# Patient Record
Sex: Male | Born: 1980 | Race: Black or African American | Hispanic: No | Marital: Married | Smoking: Current every day smoker
Health system: Southern US, Community
[De-identification: ages and names within clinical notes are randomized; demographics above are authoritative.]

---

## 2005-01-10 ENCOUNTER — Emergency Department (HOSPITAL_COMMUNITY): Admission: EM | Admit: 2005-01-10 | Discharge: 2005-01-11 | Payer: Self-pay | Admitting: Emergency Medicine

## 2006-03-23 ENCOUNTER — Emergency Department (HOSPITAL_COMMUNITY): Admission: EM | Admit: 2006-03-23 | Discharge: 2006-03-23 | Payer: Self-pay | Admitting: Emergency Medicine

## 2008-01-02 ENCOUNTER — Inpatient Hospital Stay (HOSPITAL_COMMUNITY): Admission: EM | Admit: 2008-01-02 | Discharge: 2008-01-05 | Payer: Self-pay | Admitting: Emergency Medicine

## 2009-05-02 IMAGING — CR DG ABDOMEN ACUTE W/ 1V CHEST
3 series · 3 of 3 positions shown · non-contrast
Comparison: None

CLINICAL DATA: Nausea vomiting mid abdominal pain.

ACUTE ABDOMEN SERIES (ABDOMEN 2 VIEW & CHEST 1 VIEW)

[w chest pa]
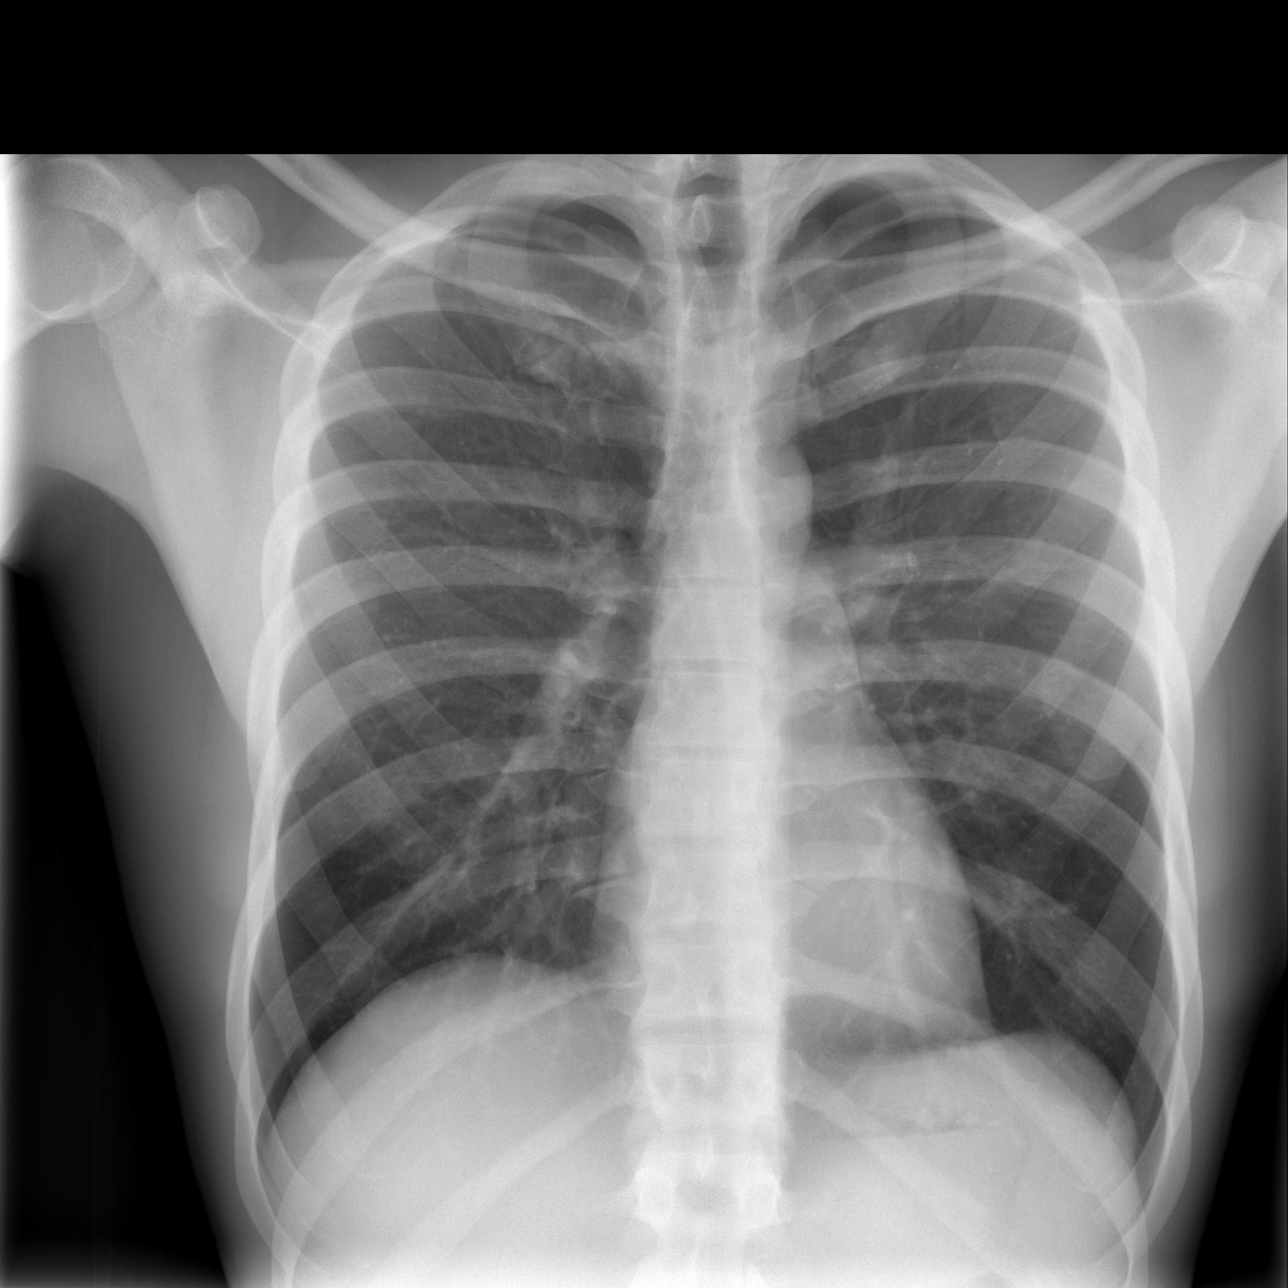

[w abdomen upright *]
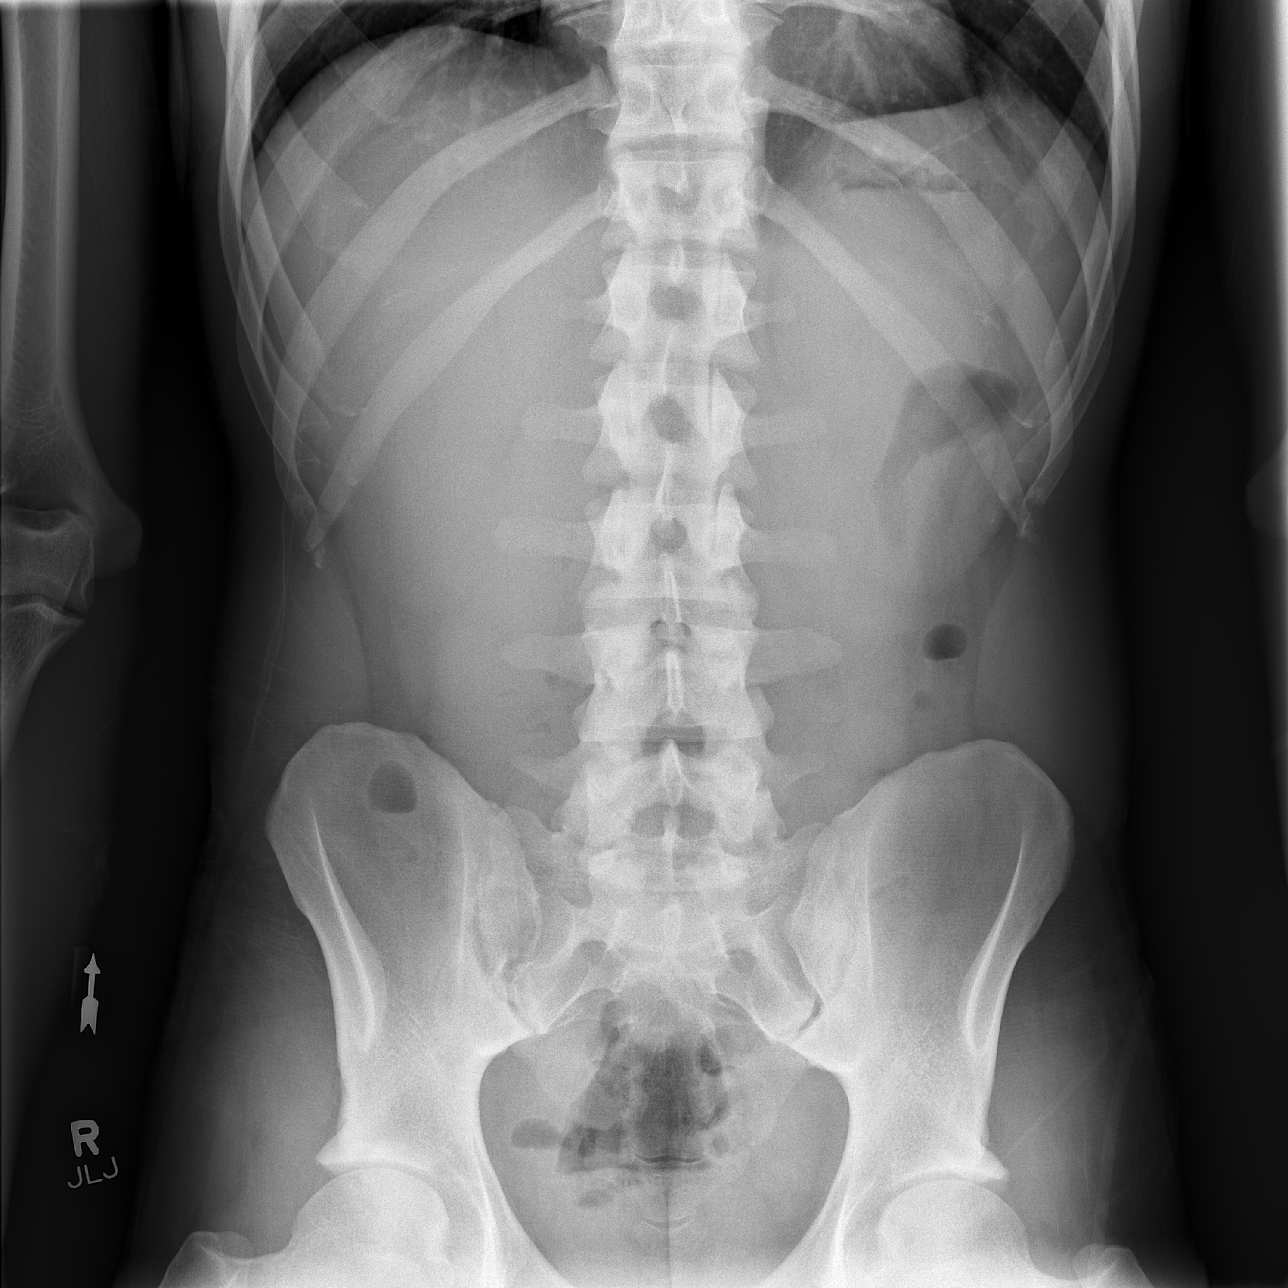

[t abdomen supine]
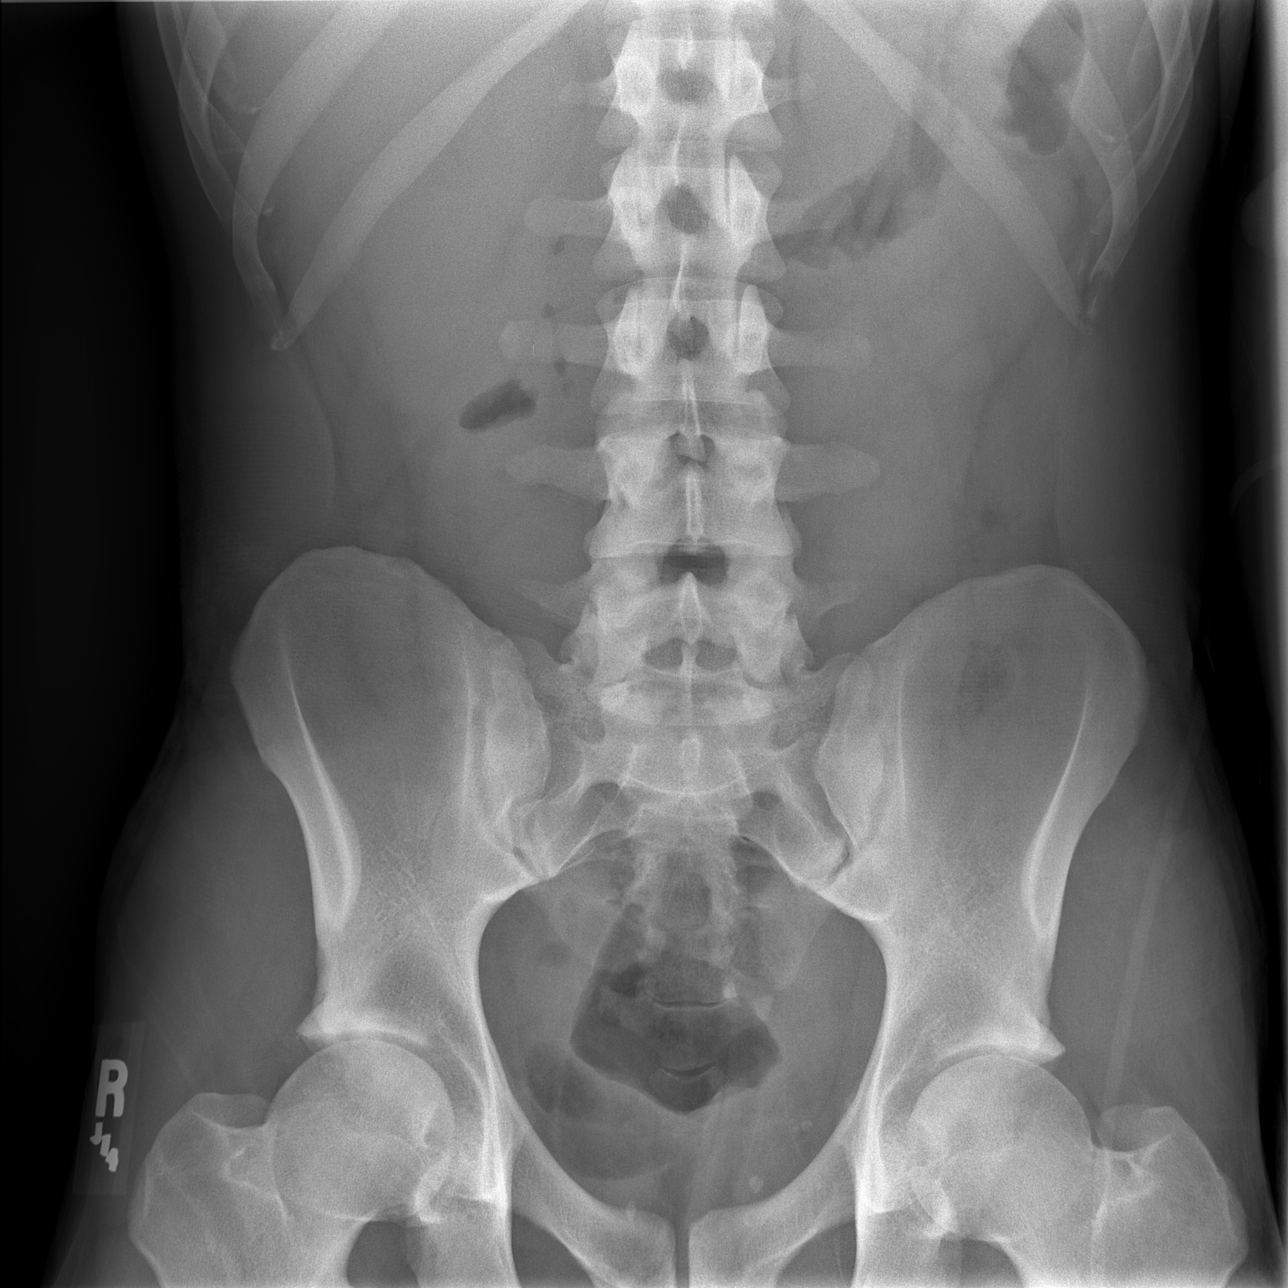

[3 of 3 positions shown; findings below may reference images not displayed]

FINDINGS: Normal chest.  Normal bowel gas pattern.  Negative for
ileus or bowel obstruction.  Properitoneal fat stripes and psoas
muscle margins are defined.  No unusual calcification.
IMPRESSION: No acute chest or abdominal disease.

## 2010-08-24 NOTE — H&P (Signed)
Phillip [name], Phillip [name]   MEDICAL RECORD NO.:  [name]          PATIENT TYPE:  INP   LOCATION:  0107                         FACILITY:  [facility]   PHYSICIAN:  Vania Rea, M.D. DATE OF BIRTH:  July 03, 1980   DATE OF ADMISSION:  01/02/2008  DATE OF DISCHARGE:                              HISTORY & PHYSICAL   PRIMARY CARE PHYSICIAN:  Unassigned.   CHIEF COMPLAINT:  Fever, nausea and vomiting for 3 days.   HISTORY OF PRESENT ILLNESS:  This is a 30 year old African American  gentleman who has had symptoms and complicated with respiratory tract  infection of fever, headache, nasal stuffiness with nausea and vomiting  for the past 3 days.  The patient has tried to self medicate with  Theraflu, Advil and NyQuil, but says they only make him drowsy; but he  continues to feel very sick.  Eventually he came to the emergency room  to be evaluated.  He was found to be dehydrated and very ill looking.  He has not responded to simple measures in the emergency room, and the  hospitalist service was called to assist in management.   PAST MEDICAL HISTORY:  No significant.   MEDICATIONS:  1. Theraflu.  2. Advil.  3. NyQuil  As noted above.   ALLERGIES:  NO KNOWN DRUG ALLERGIES.   SOCIAL HISTORY:  He is an unemployed Estate agent.  He smokes 1/2  pack a day.  He drinks alcohol 3-4 drinks a weekend.  He smokes  marijuana occasionally on the weekend.   FAMILY HISTORY:  Significant for father who died at his birth with an  acute MI.  Mother suffers with seizure disorders; otherwise  unremarkable.   REVIEW OF SYSTEMS:  Significant only for symptoms associated with upper  respiratory infection such as headache, sore throat, cough productive of  green sputum, back and shoulder pain.   PHYSICAL EXAMINATION:  Ill-looking hot-to-touch young African American  gentleman, lying on the stretcher.  VITALS:  Temperature 103.4, pulse 77, respirations 20, blood  pressure  115/74, saturations 100% on room air.  Pupils are round and equal.  Mucous membranes are pink.  Anicteric.  NECK:  Supple.  No cervical lymphadenopathy adenopathy or thyromegaly.  CHEST:  Clear to auscultation bilaterally.  CARDIOVASCULAR SYSTEM:  Regular rate and rhythm without murmurs.  ABDOMEN:  Scaphoid, soft and nontender.  EXTREMITIES:  Without edema. He has no bone or joint deformities.  CNS:  Cranial nerves II-XII are grossly intact.  Nonfocal.  No  neurologic deficits.   LABORATORY STUDIES:  White count 14.1, hemoglobin 14.6, platelets 230.  He has 8-5% neutrophils and 8% monocytes; no bandemia is described on  film.  Sodium 139, potassium 3.1, chloride 103, CO2 25, glucose 190,  creatinine 1.41.  Lipase is normal.   His chest and abdominal x-rays revealed no evidence of acute disease.   ASSESSMENT:  Respiratory infection, probably viral.  Hypokalemia   PLAN:  Will admit this gentleman for IV fluid hydration, replacement of  his potassium.  Will do final studies such as influenza A&B, since he  has not been  immunized.  Will withhold antibiotics at this time, since  there is no clear evidence of septic focus.  Will review this  gentleman's medical care as time goes by.      Vania Rea, M.D.  Electronically Signed     LC/MEDQ  D:  01/02/2008  T:  01/02/2008  Job:  [id number]

## 2010-08-24 NOTE — Discharge Summary (Signed)
NAMEVAHE, PIENTA NO.:  [id number]   MEDICAL RECORD NO.:  [id number]          PATIENT TYPE:  INP   LOCATION:  1613                         FAC[facility]  [facility]   PHYSICIAN:  Eduard Clos, MDDATE OF BIRTH:  Jun 15, 1980   DATE OF ADMISSION:  01/02/2008  DATE OF DISCHARGE:                               DISCHARGE SUMMARY   COURSE IN THE HOSPITAL:  A 30 year old male with a significant past  medical history fever nasal stuffiness, sore throat was admitted to the  medical floor.  Blood cultures were obtained on admission.  Chest x-ray  shows no acute findings.  The patient had fevers around 102-103.  Was  started on empiric antibiotic and Tamiflu was eventually added.  Influenza A and B swabs, and strep B was negative.  The patient's  symptoms improved at this time.  The patient is very eager to go home.  Culture was drawn, showing no growth so far.  As the patient's symptoms  have improved and he is eager to go home, we will discharge the patient  to home and advised to follow with the ER if there is any recurrence of  symptoms.  The patient has also been advised to quit smoking, drinking  alcohol, or using illegal drugs.   FINAL DIAGNOSIS:  1. Fever, probably from viral syndrome and upper respiratory      infection.  2. Dehydration.   MEDICATION ON DISCHARGE:  1. Doxycycline 100 mg p.o. b.i.d. for 7 days.  2. Tamiflu 75 mg p.o. b.i.d. for 4 days.   PLAN:  The patient advised to follow with his primary care physician  within 1 week's time.  Information about [facility] has been provided  to the patient.  To go to the nearest ER if fever recurs.  The patient  advised to quit smoking, drinking alcohol, and abusing drugs.      Eduard Clos, MD  Electronically Signed     ANK/MEDQ  D:  01/05/2008  T:  01/06/2008  Job:  [id number]

## 2011-01-10 LAB — BASIC METABOLIC PANEL
BUN: 4 — ABNORMAL LOW
BUN: 7
CO2: 25
CO2: 25
Calcium: 8 — ABNORMAL LOW
Calcium: 8.2 — ABNORMAL LOW
Chloride: 102
Chloride: 105
Creatinine, Ser: 1.18
Creatinine, Ser: 1.35
GFR calc Af Amer: >60
GFR calc Af Amer: >60
GFR calc non Af Amer: >60
GFR calc non Af Amer: >60
Glucose, Bld: 103 — ABNORMAL HIGH
Glucose, Bld: 111 — ABNORMAL HIGH
Potassium: 3.6
Potassium: 4
Sodium: 133 — ABNORMAL LOW
Sodium: 135

## 2011-01-10 LAB — DIFFERENTIAL
Basophils Absolute: 0
Basophils Relative: 0
Eosinophils Absolute: 0
Eosinophils Relative: 0
Lymphocytes Relative: 7 — ABNORMAL LOW
Lymphs Abs: 1
Monocytes Absolute: 1.2 — ABNORMAL HIGH
Monocytes Relative: 8
Neutro Abs: 11.9 — ABNORMAL HIGH
Neutrophils Relative %: 85 — ABNORMAL HIGH

## 2011-01-10 LAB — CBC
HCT: 36.7 — ABNORMAL LOW
HCT: 44
Hemoglobin: 12.1 — ABNORMAL LOW
Hemoglobin: 14.6
MCHC: 33.1
MCHC: 33.2
MCV: 91.5
MCV: 91.6
Platelets: 200
Platelets: 230
RBC: 4 — ABNORMAL LOW
RBC: 4.81
RDW: 14.3
RDW: 14.8
WBC: 14.1 — ABNORMAL HIGH
WBC: 9.8

## 2011-01-10 LAB — COMPREHENSIVE METABOLIC PANEL
ALT: 13
AST: 29
Albumin: 3.6
Alkaline Phosphatase: 104
BUN: 7
CO2: 25
Calcium: 9.2
Chloride: 103
Creatinine, Ser: 1.41
GFR calc Af Amer: >60
GFR calc non Af Amer: >60
Glucose, Bld: 109 — ABNORMAL HIGH
Potassium: 3.1 — ABNORMAL LOW
Sodium: 139
Total Bilirubin: 1
Total Protein: 7.6

## 2011-01-10 LAB — INFLUENZA A+B VIRUS AG-DIRECT(RAPID)
Inflenza A Ag: NEGATIVE
Influenza B Ag: NEGATIVE

## 2011-01-10 LAB — CULTURE, BLOOD (ROUTINE X 2)
Culture: NO GROWTH
Culture: NO GROWTH

## 2011-01-10 LAB — LIPASE, BLOOD: Lipase: 15

## 2011-01-10 LAB — MAGNESIUM
Magnesium: 1.3 — ABNORMAL LOW
Magnesium: 1.8

## 2011-05-19 ENCOUNTER — Emergency Department (HOSPITAL_COMMUNITY)
Admission: EM | Admit: 2011-05-19 | Discharge: 2011-05-19 | Disposition: A | Discharge status: Home / Self Care | Payer: Self-pay | Attending: Emergency Medicine | Admitting: Emergency Medicine

## 2011-05-19 ENCOUNTER — Encounter (HOSPITAL_COMMUNITY): Payer: Self-pay | Admitting: Emergency Medicine

## 2011-05-19 DIAGNOSIS — IMO0002 Reserved for concepts with insufficient information to code with codable children: Secondary | ICD-10-CM | POA: Insufficient documentation

## 2011-05-19 DIAGNOSIS — M7989 Other specified soft tissue disorders: Secondary | ICD-10-CM | POA: Insufficient documentation

## 2011-05-19 DIAGNOSIS — L732 Hidradenitis suppurativa: Secondary | ICD-10-CM | POA: Insufficient documentation

## 2011-05-19 MED ORDER — OXYCODONE-ACETAMINOPHEN 5-325 MG PO TABS: 1.0000 | ORAL_TABLET | Freq: Four times a day (QID) | ORAL | Status: AC | PRN

## 2011-05-19 MED ORDER — DOXYCYCLINE HYCLATE 100 MG PO CAPS: 100.0000 mg | ORAL_CAPSULE | Freq: Two times a day (BID) | ORAL | Status: AC

## 2011-05-19 NOTE — ED Notes (Signed)
Pt c/o bilateral axillary abscess x several weeks; pt sts right side draining

## 2011-05-19 NOTE — ED Provider Notes (Signed)
History     CSN: [id number]  Arrival date & time 05/19/11  1050   First MD Initiated Contact with Patient 05/19/11 1334      Chief Complaint  Patient presents with  . Abscess    (Consider location/radiation/quality/duration/timing/severity/associated sxs/prior treatment) HPI Patient presents with abscess in both axilla for the last several weeks.  Patient states that he has had no fevers or nausea/vomiting.  The patient states that he lanced the areas with a needle in the past and the areas returned.      History reviewed. No pertinent past surgical history.  History reviewed. No pertinent family history.  History  Substance Use Topics  . Smoking status: Current Everyday Smoker  . Smokeless tobacco: Not on file  . Alcohol Use: Yes     occasional      Review of Systems All pertinent positives and negatives reviewed in the history of present illness  Allergies  Review of patient's allergies indicates no known allergies.  Home Medications  No current outpatient prescriptions on file.  BP 120/80  Pulse 89  Temp(Src) 97.9 F (36.6 C) (Oral)  Resp 18  SpO2 99%  Physical Exam  Constitutional: He appears well-developed and well-nourished. No distress.  Cardiovascular: Normal rate and regular rhythm.  Exam reveals no gallop and no friction rub.   No murmur heard. Pulmonary/Chest: Effort normal and breath sounds normal. No respiratory distress. He has no wheezes. He has no rales.  Musculoskeletal:       The patient has areas of swelling under each axilla. No redness noted to the areas.   Skin: Skin is warm and dry.    ED Course  Procedures (including critical care time)  Labs Reviewed - No data to display No results found.   No diagnosis found.  I spoke with surgery about the patient and they will come down to see the patient.   MDM   The patient was seen by surgery and they will follow him up with him in the office.       Carlyle Dolly,  PA-C 05/19/11 1515

## 2011-05-19 NOTE — ED Notes (Signed)
Surgery consult at bedside.

## 2011-05-19 NOTE — ED Notes (Signed)
Boils under both arms  Rt worse than left now swollen and painful

## 2011-05-20 NOTE — ED Provider Notes (Signed)
Medical screening examination/treatment/procedure(s) were performed by non-physician practitioner and as supervising physician I was immediately available for consultation/collaboration.    Aymee Fomby L Alaynna Kerwood, MD 05/20/11 0855 

## 2011-06-08 ENCOUNTER — Ambulatory Visit (INDEPENDENT_AMBULATORY_CARE_PROVIDER_SITE_OTHER): Payer: Self-pay | Admitting: General Surgery

## 2011-06-17 ENCOUNTER — Encounter (INDEPENDENT_AMBULATORY_CARE_PROVIDER_SITE_OTHER): Payer: Self-pay | Admitting: General Surgery

## 2018-06-29 ENCOUNTER — Encounter (HOSPITAL_COMMUNITY): Payer: Self-pay

## 2018-06-29 ENCOUNTER — Emergency Department (HOSPITAL_COMMUNITY)
Admission: EM | Admit: 2018-06-29 | Discharge: 2018-06-29 | Disposition: A | Discharge status: Home / Self Care | Payer: BLUE CROSS/BLUE SHIELD | Attending: Emergency Medicine | Admitting: Emergency Medicine

## 2018-06-29 ENCOUNTER — Other Ambulatory Visit: Payer: Self-pay

## 2018-06-29 DIAGNOSIS — F17210 Nicotine dependence, cigarettes, uncomplicated: Secondary | ICD-10-CM | POA: Diagnosis not present

## 2018-06-29 DIAGNOSIS — K0889 Other specified disorders of teeth and supporting structures: Secondary | ICD-10-CM | POA: Insufficient documentation

## 2018-06-29 MED ORDER — LIDOCAINE VISCOUS HCL 2 % MT SOLN
15.0000 mL | Freq: Once | OROMUCOSAL | Status: AC
  Administered 2018-06-29: 15 mL via OROMUCOSAL
  Filled 2018-06-29: qty 15

## 2018-06-29 MED ORDER — LIDOCAINE VISCOUS HCL 2 % MT SOLN: 15.0000 mL | OROMUCOSAL | 0 refills | Status: AC | PRN

## 2018-06-29 MED ORDER — PENICILLIN V POTASSIUM 500 MG PO TABS: 500.0000 mg | ORAL_TABLET | Freq: Four times a day (QID) | ORAL | 0 refills | Status: AC

## 2018-06-29 MED ORDER — IBUPROFEN 800 MG PO TABS: 800.0000 mg | ORAL_TABLET | Freq: Three times a day (TID) | ORAL | 0 refills | Status: AC

## 2018-06-29 MED ORDER — HYDROCODONE-ACETAMINOPHEN 5-325 MG PO TABS
1.0000 | ORAL_TABLET | Freq: Once | ORAL | Status: AC
  Administered 2018-06-29: 1 via ORAL
  Filled 2018-06-29: qty 1

## 2018-06-29 NOTE — Discharge Instructions (Signed)
Keep your scheduled appointment with the dentist. Alternate between Tylenol and ibuprofen as needed for pain.  Viscous lidocaine will numb the area and help with pain as well. Take your full course of antibiotics. Read the instructions below.  Eat a soft or liquid diet and rinse your mouth out after meals with warm water. You should see a dentist or return here at once if you have increased swelling, increased pain or uncontrolled bleeding from the site of your injury.  SEEK MEDICAL CARE IF:  You have increased pain not controlled with medicines.  You have swelling around your tooth, in your face or neck. You have a fever >101 If you are unable to open your mouth New or worsening symptoms develop or you have any additional concerns.

## 2018-06-29 NOTE — ED Triage Notes (Signed)
Pt here for left sided dental pain that started after work.  Pt says cold water helps and has been drinking lots of water since. Pt reports he feels he has a fever. A&Ox4

## 2018-06-29 NOTE — ED Notes (Signed)
Pt refused to stop drinking cold water. Pt very agitated in RM. Pt does feel warm to touch but Pt only allowed Axillary temperature to be taken.

## 2018-06-29 NOTE — ED Provider Notes (Signed)
[facility] EMERGENCY DEPARTMENT Provider Note   CSN: [id number] Arrival date & time: 06/29/18  2033    History   Chief Complaint Chief Complaint  Patient presents with  . Dental Pain    HPI Phillip [name] is a 38 y.o. male.     The history is provided by the patient and medical records. No language interpreter was used.  Dental Pain  Associated symptoms: fever (Subjective)   Associated symptoms: no congestion and no facial swelling     Phillip [name] is a 38 y.o. male who presents to the Emergency Department complaining of persistent, gradually worsening, left-sided, lower dental pain beginning today. Pt describes their pain as throbbing. Pt has been using ibuprofen and oragel at home with minimal relief of pain. The only thing that seems to help is drinking cold water. He has not seen dentist in quite some time, but does have an appointment scheduled for Tuesday. He feels as if he has a fever, but has not checked his temperature. Pt denies facial swelling, difficulty breathing, difficulty swallowing.   History reviewed. No pertinent past medical history.  There are no active problems to display for this patient.   History reviewed. No pertinent surgical history.      Home Medications    Prior to Admission medications   Medication Sig Start Date End Date Taking? Authorizing Provider  ibuprofen (ADVIL,MOTRIN) 800 MG tablet Take 1 tablet (800 mg total) by mouth 3 (three) times daily. 06/29/18   , Chase Picket, PA-C  lidocaine (XYLOCAINE) 2 % solution Use as directed 15 mLs in the mouth or throat as needed for mouth pain. 06/29/18   , Chase Picket, PA-C  penicillin v potassium (VEETID) 500 MG tablet Take 1 tablet (500 mg total) by mouth 4 (four) times daily for 7 days. 06/29/18 07/06/18  , Chase Picket, PA-C    Family History History reviewed. No pertinent family history.  Social History Social History   Tobacco Use  . Smoking  status: Current Every Day Smoker  . Smokeless tobacco: Never Used  Substance Use Topics  . Alcohol use: Yes    Comment: occasional  . Drug use: Yes    Types: Marijuana     Allergies   Patient has no known allergies.   Review of Systems Review of Systems  Constitutional: Positive for fever (Subjective).  HENT: Positive for dental problem. Negative for congestion, facial swelling, sore throat and trouble swallowing.   Respiratory: Negative for cough and shortness of breath.      Physical Exam Updated Vital Signs BP (!) 144/92 (BP Location: Right Arm)   Pulse 85   Temp 97.8 F (36.6 C) (Axillary)   Resp (!) 24   SpO2 100%   Physical Exam Vitals signs and nursing note reviewed.  Constitutional:      General: He is not in acute distress.    Appearance: He is well-developed.     Comments: Non-toxic appearing.  HENT:     Head: Normocephalic and atraumatic.     Mouth/Throat:      Comments: Pain along tooth as depicted in image. No abscess noted. Midline uvula. No trismus. OP moist and clear. No oropharyngeal erythema or edema. Neck supple with no tenderness. No facial edema. Neck:     Musculoskeletal: Neck supple.  Cardiovascular:     Rate and Rhythm: Normal rate and regular rhythm.     Heart sounds: Normal heart sounds. No murmur.  Pulmonary:     Effort:  Pulmonary effort is normal. No respiratory distress.     Breath sounds: Normal breath sounds. No wheezing or rales.  Musculoskeletal: Normal range of motion.  Skin:    General: Skin is warm and dry.  Neurological:     Mental Status: He is alert.      ED Treatments / Results  Labs (all labs ordered are listed, but only abnormal results are displayed) Labs Reviewed - No data to display  EKG None  Radiology No results found.  Procedures Procedures (including critical care time)  Medications Ordered in ED Medications  HYDROcodone-acetaminophen (NORCO/VICODIN) 5-325 MG per tablet 1 tablet (1 tablet Oral  Given 06/29/18 2117)  lidocaine (XYLOCAINE) 2 % viscous mouth solution 15 mL (15 mLs Mouth/Throat Given 06/29/18 2117)     Initial Impression / Assessment and Plan / ED Course  I have reviewed the triage vital signs and the nursing notes.  Pertinent labs & imaging results that were available during my care of the patient were reviewed by me and considered in my medical decision making (see chart for details).       Phillip [name] is a 38 y.o. male who presents to ED for dental pain. No abscess requiring immediate incision and drainage. Patient is non toxic appearing, and swallowing secretions well. Exam not concerning for Ludwig's angina or pharyngeal abscess. Will treat with PenVK. He has an appointment with the dentist on Tuesday and was encouraged to keep this appointment. We discussed reasons to return to the ER at length. Patient voices understanding and is agreeable to plan.   Final Clinical Impressions(s) / ED Diagnoses   Final diagnoses:  Pain, dental    ED Discharge Orders         Ordered    penicillin v potassium (VEETID) 500 MG tablet  4 times daily     06/29/18 2112    ibuprofen (ADVIL,MOTRIN) 800 MG tablet  3 times daily     06/29/18 2112    lidocaine (XYLOCAINE) 2 % solution  As needed     06/29/18 2112           , Chase Picket, PA-C 06/29/18 2135    Margarita Grizzle, MD 06/30/18 [phone]

## 2018-06-29 NOTE — ED Notes (Signed)
Patient Alert and oriented to baseline. Stable and ambulatory to baseline. Patient verbalized understanding of the discharge instructions.  Patient belongings were taken by the patient.   

## 2021-05-20 ENCOUNTER — Other Ambulatory Visit (HOSPITAL_COMMUNITY): Payer: Self-pay

## 2021-05-24 ENCOUNTER — Other Ambulatory Visit (HOSPITAL_COMMUNITY): Payer: Self-pay
